# Patient Record
Sex: Female | Born: 1980
Health system: Southern US, Community
[De-identification: ages and names within clinical notes are randomized; demographics above are authoritative.]

## PROBLEM LIST (undated history)

## (undated) DIAGNOSIS — Z789 Other specified health status: Secondary | ICD-10-CM

## (undated) HISTORY — PX: NO PAST SURGERIES: SHX2092

## (undated) HISTORY — PX: WISDOM TOOTH EXTRACTION: SHX21

---

## 2014-09-08 ENCOUNTER — Emergency Department (HOSPITAL_COMMUNITY)
Admission: EM | Admit: 2014-09-08 | Discharge: 2014-09-08 | Disposition: A | Discharge status: Home / Self Care | Payer: 59 | Attending: Emergency Medicine | Admitting: Emergency Medicine

## 2014-09-08 ENCOUNTER — Encounter (HOSPITAL_COMMUNITY): Payer: Self-pay | Admitting: Emergency Medicine

## 2014-09-08 ENCOUNTER — Emergency Department (HOSPITAL_COMMUNITY): Payer: 59

## 2014-09-08 DIAGNOSIS — IMO0002 Reserved for concepts with insufficient information to code with codable children: Secondary | ICD-10-CM

## 2014-09-08 DIAGNOSIS — S66801A Unspecified injury of other specified muscles, fascia and tendons at wrist and hand level, right hand, initial encounter: Secondary | ICD-10-CM

## 2014-09-08 DIAGNOSIS — Y93G1 Activity, food preparation and clean up: Secondary | ICD-10-CM | POA: Insufficient documentation

## 2014-09-08 DIAGNOSIS — F419 Anxiety disorder, unspecified: Secondary | ICD-10-CM | POA: Insufficient documentation

## 2014-09-08 DIAGNOSIS — Y92090 Kitchen in other non-institutional residence as the place of occurrence of the external cause: Secondary | ICD-10-CM | POA: Insufficient documentation

## 2014-09-08 DIAGNOSIS — S61011A Laceration without foreign body of right thumb without damage to nail, initial encounter: Secondary | ICD-10-CM | POA: Insufficient documentation

## 2014-09-08 DIAGNOSIS — W25XXXA Contact with sharp glass, initial encounter: Secondary | ICD-10-CM | POA: Insufficient documentation

## 2014-09-08 MED ORDER — TETANUS-DIPHTH-ACELL PERTUSSIS 5-2.5-18.5 LF-MCG/0.5 IM SUSP: 0.5000 mL | Freq: Once | INTRAMUSCULAR | Status: DC

## 2014-09-08 MED ORDER — LIDOCAINE HCL 1 % IJ SOLN
10.0000 mL | Freq: Once | INTRAMUSCULAR | Status: AC
  Administered 2014-09-08: 10 mL

## 2014-09-08 MED ORDER — CEPHALEXIN 500 MG PO CAPS: 500.0000 mg | ORAL_CAPSULE | Freq: Four times a day (QID) | ORAL | Status: AC

## 2014-09-08 MED ORDER — IBUPROFEN 600 MG PO TABS: 600.0000 mg | ORAL_TABLET | Freq: Four times a day (QID) | ORAL | Status: AC | PRN

## 2014-09-08 NOTE — ED Notes (Signed)
Pt states she was unlaoding her dishwasher when a pan broke and lacerated her right inner thumb. Bleeding is controlled upon assessment.

## 2014-09-08 NOTE — ED Provider Notes (Signed)
CSN: [id number]     Arrival date & time 09/08/14  1935 History  This chart was scribed for non-physician practitioner, Antony MaduraKelly Franklin Baumbach, PA-C, working with Toy CookeyMegan Docherty, MD by Milly JakobJohn Lee Graves, ED Scribe. The patient was seen in room WTR8/WTR8. Patient's care was started at 8:05 PM.  Chief Complaint  Patient presents with  . Extremity Laceration   The history is provided by the patient. No language interpreter was used.   HPI Comments: Deborah Ramos is a 33 y.o. female who presents to the Emergency Department after a deep laceration to the base of her right thumb about 45 minutes ago. She states that she shattered a clean glass dish in the kitchen, and that there may be glass pieces in the wound. She reports constant, moderate, throbbing pain at the site of the laceration. She reports limited mobility of her right thumb. She denies numbness or loss of sensation. She is unsure if her Tetanus is UTD. She denies other injury.  History reviewed. No pertinent past medical history. History reviewed. No pertinent past surgical history. No family history on file. History  Substance Use Topics  . Smoking status: Never Smoker   . Smokeless tobacco: Never Used  . Alcohol Use: No   OB History    No data available     Review of Systems  Skin: Positive for wound (right thumb laceration). Negative for pallor.  Neurological: Negative for numbness.  All other systems reviewed and are negative.   Allergies  Review of patient's allergies indicates no known allergies.  Home Medications   Prior to Admission medications   Medication Sig Start Date End Date Taking? Authorizing Provider  nicotine polacrilex (NICOTINE MINI) 2 MG lozenge Take 2 mg by mouth as needed for smoking cessation.   Yes Historical Provider, MD  cephALEXin (KEFLEX) 500 MG capsule Take 1 capsule (500 mg total) by mouth 4 (four) times daily. 09/08/14   Antony MaduraKelly Li Fragoso, PA-C  ibuprofen (ADVIL,MOTRIN) 600 MG tablet Take 1 tablet (600 mg  total) by mouth every 6 (six) hours as needed. 09/08/14   Antony MaduraKelly Usbaldo Pannone, PA-C   Triage Vitals: BP 123/86 mmHg  Pulse 68  Temp(Src) 98.9 F (37.2 C)  Resp 18  Ht 5\' 4"  (1.626 m)  Wt 120 lb (54.432 kg)  BMI 20.59 kg/m2  SpO2 100%  LMP 08/14/2014 (Approximate)  Physical Exam  Constitutional: She is oriented to person, place, and time. She appears well-developed and well-nourished. No distress.  HENT:  Head: Normocephalic and atraumatic.  Eyes: Conjunctivae and EOM are normal. No scleral icterus.  Neck: Normal range of motion.  Cardiovascular: Normal rate, regular rhythm and intact distal pulses.   2+ distal radial pulse in RUE. Capillary refill brisk in all digits of R hand.  Pulmonary/Chest: Effort normal. No respiratory distress.  Musculoskeletal: She exhibits tenderness.       Right hand: She exhibits decreased range of motion, tenderness and laceration (2cm laceration just distal to MCP joint on palmar aspect of R thumb). She exhibits no bony tenderness, normal capillary refill, no deformity and no swelling. Normal sensation noted.  Unable to flex R thumb at DIP joint. Tendon evident when manually extending R thumb. Suspect severed flexor pollicis longus tendon. No evidence of FB within wound. Exam limited secondary to patient anxiety.  Neurological: She is alert and oriented to person, place, and time. She exhibits normal muscle tone. Coordination normal.  Sensation to light touch intact in distal tips of all fingers of R hand.  Skin: Skin  is warm and dry. No rash noted. She is not diaphoretic. No erythema. No pallor.  Psychiatric: Her behavior is normal. Her mood appears anxious. Her speech is rapid and/or pressured.  Nursing note and vitals reviewed.   ED Course  Procedures (including critical care time) DIAGNOSTIC STUDIES: Oxygen Saturation is 100% on room air, normal by my interpretation.    COORDINATION OF CARE: 8:20 PM-Discussed treatment plan which includes left thumb  X-ray and f/u with a hand surgeon with pt at bedside and pt agreed to plan.   Labs Review Labs Reviewed - No data to display  Imaging Review Dg Finger Thumb Right  09/08/2014   CLINICAL DATA:  Laceration at the MCP joint related to glass pan.  EXAM: RIGHT THUMB 2+V  COMPARISON:  None.  FINDINGS: Soft tissue injury is noted full are too the metacarpal phalangeal joint. No evidence of fracture, radiopaque foreign object or gas in the joint.  IMPRESSION: Soft tissue injury. No bony or articular abnormality. No radiopaque foreign object.   Electronically Signed   By: Paulina FusiMark  Shogry M.D.   On: 09/08/2014 21:17     EKG Interpretation None       LACERATION REPAIR Performed by: Antony MaduraHUMES, Deldrick Linch Authorized by: Antony MaduraHUMES, Shanik [place] Consent: Verbal consent obtained. Risks and benefits: risks, benefits and alternatives were discussed Consent given by: patient Patient identity confirmed: provided demographic data Prepped and Draped in normal sterile fashion Wound explored  Laceration Location: Palmar R thumb  Laceration Length: 2cm  No Foreign Bodies seen or palpated  Anesthesia: local infiltration  Local anesthetic: lidocaine 1% without epinephrine  Anesthetic total: 1 ml  Irrigation method: syringe Amount of cleaning: standard  Skin closure: 5-0 prolene  Number of sutures: 2  Technique: simple interrupted  Patient tolerance: Patient tolerated the procedure well with no immediate complications.   MDM   Final diagnoses:  Laceration  Injury of flexor tendon of hand, right, initial encounter  Thumb laceration, right, initial encounter    33 year old female presents to the emergency department for further evaluation of laceration. Laceration sustained from glass plate which broke while doing dishes.patient is neurovascularly intact on exam. Laceration is appreciated to be just distal to the MCP joint of the right thumb on the palmar aspect. Patient is unable to flex her DIP joint of this  thumb suggesting likely flexor pollicis longus tendon injury. Imaging shows no evidence of foreign body or fracture.   Laceration closed in ED and patient placed on Keflex course. Case discussed with Dr. Amanda PeaGramig who will follow up with patient in the office on 09/09/2014.tetanus advised as patient cannot recall the exact date of her last tetanus. She declines, as she states that she believes she is up-to-date. She states that she will confirm this with her primary care doctor and up-to-date her tetanus with him if necessary. Patient stable and appropriate for discharge at this time. Return precautions provided and patient agreeable to plan with no unaddressed concerns.  I personally performed the services described in this documentation, which was scribed in my presence. The recorded information has been reviewed and is accurate.   Filed Vitals:   09/08/14 2033  BP: 123/86  Pulse: 68  Temp: 98.9 F (37.2 C)  Resp: 18  Height: 5\' 4"  (1.626 m)  Weight: 120 lb (54.432 kg)  SpO2: 100%      Antony MaduraKelly Raykwon Hobbs, PA-C 09/09/14 [phone]

## 2014-09-08 NOTE — Discharge Instructions (Signed)
Tendon Injury Tendons are strong, cordlike structures that connect muscle to bone. Tendons are made up of woven fibers, like a rope. A tendon injury is a tear (rupture) of the tendon. The rupture may be partial (only a few of the fibers in your tendon rupture) or complete (your entire tendon ruptures). CAUSES  Tendon injuries can be caused by high-stress activities, such as sports. They also can be caused by a repetitive injury or by a single injury from an excessive, rapid force. SYMPTOMS  Symptoms of tendon injury include pain when you move the joint close to the tendon. Other symptoms are swelling, redness, and warmth. DIAGNOSIS  Tendon injuries often can be diagnosed by physical exam. However, sometimes an X-ray exam or advanced imaging, such as magnetic resonance imaging (MRI), is necessary to determine the extent of the injury. TREATMENT  Partial tendon ruptures often can be treated with immobilization. A splint, bandage, or removable brace usually is used to immobilize the injured tendon. Most injured tendons need to be immobilized for 1-2 months before they are completely healed. Complete tendon ruptures may require surgical reattachment. Document Released: 12/02/2004 Document Revised: 10/14/2011 Document Reviewed: 01/16/2012 [facility] Patient Information 2015 [place]. This information is not intended to replace advice given to you by your health care provider. Make sure you discuss any questions you have with your health care provider. Laceration Care, Adult A laceration is a cut or lesion that goes through all layers of the skin and into the tissue just beneath the skin. TREATMENT  Some lacerations may not require closure. Some lacerations may not be able to be closed due to an increased risk of infection. It is important to see your caregiver as soon as possible after an injury to minimize the risk of infection and maximize the opportunity for successful closure. If closure is  appropriate, pain medicines may be given, if needed. The wound will be cleaned to help prevent infection. Your caregiver will use stitches (sutures), staples, wound glue (adhesive), or skin adhesive strips to repair the laceration. These tools bring the skin edges together to allow for faster healing and a better cosmetic outcome. However, all wounds will heal with a scar. Once the wound has healed, scarring can be minimized by covering the wound with sunscreen during the day for 1 full year. HOME CARE INSTRUCTIONS  For sutures or staples:  Keep the wound clean and dry.  If you were given a bandage (dressing), you should change it at least once a day. Also, change the dressing if it becomes wet or dirty, or as directed by your caregiver.  Wash the wound with soap and water 2 times a day. Rinse the wound off with water to remove all soap. Pat the wound dry with a clean towel.  After cleaning, apply a thin layer of the antibiotic ointment as recommended by your caregiver. This will help prevent infection and keep the dressing from sticking.  You may shower as usual after the first 24 hours. Do not soak the wound in water until the sutures are removed.  Only take over-the-counter or prescription medicines for pain, discomfort, or fever as directed by your caregiver.  Get your sutures or staples removed as directed by your caregiver. For skin adhesive strips:  Keep the wound clean and dry.  Do not get the skin adhesive strips wet. You may bathe carefully, using caution to keep the wound dry.  If the wound gets wet, pat it dry with a clean towel.  Skin  skin adhesive strips wet. You may bathe carefully, using caution to keep the wound dry.   If the wound gets wet, pat it dry with a clean towel.   Skin adhesive strips will fall off on their own. You may trim the strips as the wound heals. Do not remove skin adhesive strips that are still stuck to the wound. They will fall off in time.  For wound adhesive:   You may briefly wet your wound in the shower or bath. Do not soak or scrub the wound. Do not swim. Avoid periods of heavy perspiration until  the skin adhesive has fallen off on its own. After showering or bathing, gently pat the wound dry with a clean towel.   Do not apply liquid medicine, cream medicine, or ointment medicine to your wound while the skin adhesive is in place. This may loosen the film before your wound is healed.   If a dressing is placed over the wound, be careful not to apply tape directly over the skin adhesive. This may cause the adhesive to be pulled off before the wound is healed.   Avoid prolonged exposure to sunlight or tanning lamps while the skin adhesive is in place. Exposure to ultraviolet light in the first year will darken the scar.   The skin adhesive will usually remain in place for 5 to 10 days, then naturally fall off the skin. Do not pick at the adhesive film.  You may need a tetanus shot if:   You cannot remember when you had your last tetanus shot.   You have never had a tetanus shot.  If you get a tetanus shot, your arm may swell, get red, and feel warm to the touch. This is common and not a problem. If you need a tetanus shot and you choose not to have one, there is a rare chance of getting tetanus. Sickness from tetanus can be serious.  SEEK MEDICAL CARE IF:    You have redness, swelling, or increasing pain in the wound.   You see a red line that goes away from the wound.   You have yellowish-white fluid (pus) coming from the wound.   You have a fever.   You notice a bad smell coming from the wound or dressing.   Your wound breaks open before or after sutures have been removed.   You notice something coming out of the wound such as wood or glass.   Your wound is on your hand or foot and you cannot move a finger or toe.  SEEK IMMEDIATE MEDICAL CARE IF:    Your pain is not controlled with prescribed medicine.   You have severe swelling around the wound causing pain and numbness or a change in color in your arm, hand, leg, or foot.   Your wound splits open and starts bleeding.   You have worsening  numbness, weakness, or loss of function of any joint around or beyond the wound.   You develop painful lumps near the wound or on the skin anywhere on your body.  MAKE SURE YOU:    Understand these instructions.   Will watch your condition.   Will get help right away if you are not doing well or get worse.  Document Released: 10/25/2005 Document Revised: 01/17/2012 Document Reviewed: 04/20/2011  [employer] Patient Information 2015 [employer]. This information is not intended to replace advice given to you by your health care provider. Make sure you discuss any questions you have with your

## 2014-09-08 NOTE — ED Notes (Signed)
Ortho Tech at bedside to splint thumb

## 2014-09-09 ENCOUNTER — Encounter (HOSPITAL_COMMUNITY): Payer: Self-pay | Admitting: *Deleted

## 2014-09-09 NOTE — Progress Notes (Signed)
No pre-op orders in EPIC, called and left message at Dr. Carlos LeveringGramig's office to [facility]

## 2014-09-10 ENCOUNTER — Ambulatory Visit (HOSPITAL_COMMUNITY)
Admission: RE | Admit: 2014-09-10 | Discharge: 2014-09-10 | Disposition: A | Discharge status: Home / Self Care | Payer: 59 | Source: Ambulatory Visit | Attending: Orthopedic Surgery | Admitting: Orthopedic Surgery

## 2014-09-10 ENCOUNTER — Ambulatory Visit (HOSPITAL_COMMUNITY): Payer: 59 | Admitting: Anesthesiology

## 2014-09-10 ENCOUNTER — Encounter (HOSPITAL_COMMUNITY): Payer: Self-pay | Admitting: *Deleted

## 2014-09-10 ENCOUNTER — Encounter (HOSPITAL_COMMUNITY)
Admission: RE | Disposition: A | Discharge status: Home / Self Care | Payer: Self-pay | Source: Ambulatory Visit | Attending: Orthopedic Surgery

## 2014-09-10 DIAGNOSIS — Z87891 Personal history of nicotine dependence: Secondary | ICD-10-CM | POA: Diagnosis not present

## 2014-09-10 DIAGNOSIS — Y929 Unspecified place or not applicable: Secondary | ICD-10-CM | POA: Insufficient documentation

## 2014-09-10 DIAGNOSIS — S66021A Laceration of long flexor muscle, fascia and tendon of right thumb at wrist and hand level, initial encounter: Secondary | ICD-10-CM | POA: Diagnosis not present

## 2014-09-10 DIAGNOSIS — X58XXXA Exposure to other specified factors, initial encounter: Secondary | ICD-10-CM | POA: Diagnosis not present

## 2014-09-10 DIAGNOSIS — Z79899 Other long term (current) drug therapy: Secondary | ICD-10-CM | POA: Insufficient documentation

## 2014-09-10 HISTORY — DX: Other specified health status: Z78.9

## 2014-09-10 HISTORY — PX: I & D EXTREMITY: SHX5045

## 2014-09-10 LAB — CBC
HCT: 45 % (ref 36.0–46.0)
Hemoglobin: 15.5 g/dL — ABNORMAL HIGH (ref 12.0–15.0)
MCH: 31.7 pg (ref 26.0–34.0)
MCHC: 34.4 g/dL (ref 30.0–36.0)
MCV: 92 fL (ref 78.0–100.0)
PLATELETS: 310 10*3/uL (ref 150–400)
RBC: 4.89 MIL/uL (ref 3.87–5.11)
RDW: 12.5 % (ref 11.5–15.5)
WBC: 9.4 10*3/uL (ref 4.0–10.5)

## 2014-09-10 LAB — HCG, SERUM, QUALITATIVE: Preg, Serum: NEGATIVE

## 2014-09-10 SURGERY — IRRIGATION AND DEBRIDEMENT EXTREMITY
Anesthesia: Monitor Anesthesia Care | Site: Thumb | Laterality: Right

## 2014-09-10 MED ORDER — HYDROMORPHONE HCL 1 MG/ML IJ SOLN: 0.2500 mg | INTRAMUSCULAR | Status: DC | PRN

## 2014-09-10 MED ORDER — MIDAZOLAM HCL 2 MG/2ML IJ SOLN
INTRAMUSCULAR | Status: AC
  Filled 2014-09-10: qty 2

## 2014-09-10 MED ORDER — FENTANYL CITRATE 0.05 MG/ML IJ SOLN
INTRAMUSCULAR | Status: AC
  Administered 2014-09-10: 100 ug
  Filled 2014-09-10: qty 2

## 2014-09-10 MED ORDER — ONDANSETRON HCL 4 MG/2ML IJ SOLN
INTRAMUSCULAR | Status: DC | PRN
  Administered 2014-09-10: 4 mg via INTRAVENOUS

## 2014-09-10 MED ORDER — OXYCODONE HCL 5 MG/5ML PO SOLN: 5.0000 mg | Freq: Once | ORAL | Status: DC | PRN

## 2014-09-10 MED ORDER — OXYCODONE HCL 5 MG PO TABS: 5.0000 mg | ORAL_TABLET | Freq: Once | ORAL | Status: DC | PRN

## 2014-09-10 MED ORDER — PROPOFOL 10 MG/ML IV BOLUS
INTRAVENOUS | Status: AC
  Filled 2014-09-10: qty 20

## 2014-09-10 MED ORDER — SUFENTANIL CITRATE 50 MCG/ML IV SOLN
INTRAVENOUS | Status: DC | PRN
  Administered 2014-09-10: 10 ug via INTRAVENOUS

## 2014-09-10 MED ORDER — ONDANSETRON HCL 4 MG/2ML IJ SOLN: 4.0000 mg | Freq: Once | INTRAMUSCULAR | Status: DC | PRN

## 2014-09-10 MED ORDER — SODIUM CHLORIDE 0.9 % IR SOLN
Status: DC | PRN
  Administered 2014-09-10: 1000 mL

## 2014-09-10 MED ORDER — SUFENTANIL CITRATE 50 MCG/ML IV SOLN
INTRAVENOUS | Status: AC
  Filled 2014-09-10: qty 1

## 2014-09-10 MED ORDER — MIDAZOLAM HCL 2 MG/2ML IJ SOLN
INTRAMUSCULAR | Status: AC
  Administered 2014-09-10: 2 mg
  Filled 2014-09-10: qty 2

## 2014-09-10 MED ORDER — DEXAMETHASONE SODIUM PHOSPHATE 4 MG/ML IJ SOLN
INTRAMUSCULAR | Status: AC
  Filled 2014-09-10: qty 1

## 2014-09-10 MED ORDER — MIDAZOLAM HCL 5 MG/5ML IJ SOLN
INTRAMUSCULAR | Status: DC | PRN
  Administered 2014-09-10: 2 mg via INTRAVENOUS

## 2014-09-10 MED ORDER — LACTATED RINGERS IV SOLN
INTRAVENOUS | Status: DC
  Administered 2014-09-10: 15:00:00 via INTRAVENOUS

## 2014-09-10 MED ORDER — MEPERIDINE HCL 25 MG/ML IJ SOLN: 6.2500 mg | INTRAMUSCULAR | Status: DC | PRN

## 2014-09-10 MED ORDER — CEFAZOLIN SODIUM-DEXTROSE 2-3 GM-% IV SOLR
INTRAVENOUS | Status: DC | PRN
  Administered 2014-09-10: 2 g via INTRAVENOUS

## 2014-09-10 MED ORDER — FENTANYL CITRATE 0.05 MG/ML IJ SOLN
INTRAMUSCULAR | Status: AC
  Filled 2014-09-10: qty 5

## 2014-09-10 MED ORDER — BUPIVACAINE-EPINEPHRINE (PF) 0.5% -1:200000 IJ SOLN
INTRAMUSCULAR | Status: DC | PRN
  Administered 2014-09-10: 30 mL via PERINEURAL

## 2014-09-10 MED ORDER — LIDOCAINE HCL (CARDIAC) 20 MG/ML IV SOLN
INTRAVENOUS | Status: AC
  Filled 2014-09-10: qty 5

## 2014-09-10 MED ORDER — SODIUM CHLORIDE 0.9 % IJ SOLN
INTRAMUSCULAR | Status: AC
  Filled 2014-09-10: qty 10

## 2014-09-10 MED ORDER — ONDANSETRON HCL 4 MG/2ML IJ SOLN
INTRAMUSCULAR | Status: AC
  Filled 2014-09-10: qty 2

## 2014-09-10 MED ORDER — LIDOCAINE HCL (CARDIAC) 20 MG/ML IV SOLN
INTRAVENOUS | Status: DC | PRN
  Administered 2014-09-10 (×2): 40 mg via INTRAVENOUS

## 2014-09-10 MED ORDER — LACTATED RINGERS IV SOLN
INTRAVENOUS | Status: DC | PRN
  Administered 2014-09-10: 17:00:00 via INTRAVENOUS

## 2014-09-10 MED ORDER — DEXAMETHASONE SODIUM PHOSPHATE 4 MG/ML IJ SOLN
INTRAMUSCULAR | Status: DC | PRN
  Administered 2014-09-10: 4 mg via INTRAVENOUS

## 2014-09-10 MED ORDER — BUPIVACAINE HCL (PF) 0.25 % IJ SOLN
INTRAMUSCULAR | Status: AC
  Filled 2014-09-10: qty 30

## 2014-09-10 MED ORDER — CEFAZOLIN SODIUM-DEXTROSE 2-3 GM-% IV SOLR
INTRAVENOUS | Status: AC
  Filled 2014-09-10: qty 50

## 2014-09-10 MED ORDER — PROPOFOL 10 MG/ML IV BOLUS
INTRAVENOUS | Status: DC | PRN
  Administered 2014-09-10: 30 mg via INTRAVENOUS
  Administered 2014-09-10: 20 mg via INTRAVENOUS
  Administered 2014-09-10: 30 mg via INTRAVENOUS
  Administered 2014-09-10: 50 mg via INTRAVENOUS

## 2014-09-10 SURGICAL SUPPLY — 45 items
BANDAGE ELASTIC 3 VELCRO ST LF (GAUZE/BANDAGES/DRESSINGS) ×2 IMPLANT
BANDAGE ELASTIC 4 VELCRO ST LF (GAUZE/BANDAGES/DRESSINGS) ×2 IMPLANT
BNDG CONFORM 2 STRL LF (GAUZE/BANDAGES/DRESSINGS) ×2 IMPLANT
BNDG CONFORM 3 STRL LF (GAUZE/BANDAGES/DRESSINGS) ×2 IMPLANT
BNDG GAUZE ELAST 4 BULKY (GAUZE/BANDAGES/DRESSINGS) ×2 IMPLANT
CORDS BIPOLAR (ELECTRODE) ×2 IMPLANT
CUFF TOURNIQUET SINGLE 18IN (TOURNIQUET CUFF) ×2 IMPLANT
DRSG EMULSION OIL 3X3 NADH (GAUZE/BANDAGES/DRESSINGS) ×2 IMPLANT
GAUZE SPONGE 4X4 12PLY STRL (GAUZE/BANDAGES/DRESSINGS) ×2 IMPLANT
GAUZE XEROFORM 1X8 LF (GAUZE/BANDAGES/DRESSINGS) ×2 IMPLANT
GLOVE BIOGEL M STRL SZ7.5 (GLOVE) ×4 IMPLANT
GLOVE BIOGEL PI IND STRL 6.5 (GLOVE) ×1 IMPLANT
GLOVE BIOGEL PI INDICATOR 6.5 (GLOVE) ×1
GLOVE BIOGEL PI ORTHO PRO SZ7 (GLOVE) ×1
GLOVE PI ORTHO PRO STRL SZ7 (GLOVE) ×1 IMPLANT
GLOVE SS BIOGEL STRL SZ 8 (GLOVE) ×1 IMPLANT
GLOVE SUPERSENSE BIOGEL SZ 8 (GLOVE) ×1
GLOVE SURG SS PI 6.5 STRL IVOR (GLOVE) ×4 IMPLANT
GOWN STRL REUS W/ TWL LRG LVL3 (GOWN DISPOSABLE) ×2 IMPLANT
GOWN STRL REUS W/ TWL XL LVL3 (GOWN DISPOSABLE) ×2 IMPLANT
GOWN STRL REUS W/TWL LRG LVL3 (GOWN DISPOSABLE) ×2
GOWN STRL REUS W/TWL XL LVL3 (GOWN DISPOSABLE) ×2
KIT BASIN OR (CUSTOM PROCEDURE TRAY) ×2 IMPLANT
KIT ROOM TURNOVER OR (KITS) ×2 IMPLANT
MANIFOLD NEPTUNE II (INSTRUMENTS) IMPLANT
NEEDLE HYPO 25GX1X1/2 BEV (NEEDLE) ×2 IMPLANT
NS IRRIG 1000ML POUR BTL (IV SOLUTION) ×2 IMPLANT
PACK ORTHO EXTREMITY (CUSTOM PROCEDURE TRAY) ×2 IMPLANT
PAD ARMBOARD 7.5X6 YLW CONV (MISCELLANEOUS) ×4 IMPLANT
SPLINT FIBERGLASS 4X30 (CAST SUPPLIES) ×2 IMPLANT
SPONGE GAUZE 4X4 12PLY STER LF (GAUZE/BANDAGES/DRESSINGS) ×2 IMPLANT
SPONGE LAP 18X18 X RAY DECT (DISPOSABLE) ×2 IMPLANT
SPONGE LAP 4X18 X RAY DECT (DISPOSABLE) ×2 IMPLANT
SUT FIBERWIRE 3-0 18 TAPR NDL (SUTURE) ×2
SUT FIBERWIRE 4-0 18 TAPR NDL (SUTURE) ×2
SUT PROLENE 4 0 P 3 18 (SUTURE) ×2 IMPLANT
SUT PROLENE 4 0 PS 2 18 (SUTURE) ×2 IMPLANT
SUT PROLENE 6 0 P 1 18 (SUTURE) ×2 IMPLANT
SUTURE FIBERWR 3-0 18 TAPR NDL (SUTURE) ×1 IMPLANT
SUTURE FIBERWR 4-0 18 TAPR NDL (SUTURE) ×1 IMPLANT
TOWEL OR 17X24 6PK STRL BLUE (TOWEL DISPOSABLE) ×2 IMPLANT
TOWEL OR 17X26 10 PK STRL BLUE (TOWEL DISPOSABLE) ×2 IMPLANT
TUBE CONNECTING 12X1/4 (SUCTIONS) ×2 IMPLANT
WATER STERILE IRR 1000ML POUR (IV SOLUTION) ×2 IMPLANT
YANKAUER SUCT BULB TIP NO VENT (SUCTIONS) ×2 IMPLANT

## 2014-09-10 NOTE — Anesthesia Preprocedure Evaluation (Addendum)
Anesthesia Evaluation  Patient identified by MRN, date of birth, ID band Patient awake    Reviewed: Allergy & Precautions, H&P , NPO status , Patient's Chart, lab work & pertinent test results  Airway Mallampati: I  TM Distance: >3 FB Neck ROM: Full    Dental  (+) Teeth Intact, Dental Advisory Given   Pulmonary former smoker,          Cardiovascular negative cardio ROS      Neuro/Psych negative neurological ROS  negative psych ROS   GI/Hepatic negative GI ROS, Neg liver ROS,   Endo/Other  negative endocrine ROS  Renal/GU negative Renal ROS     Musculoskeletal negative musculoskeletal ROS (+)   Abdominal   Peds  Hematology negative hematology ROS (+)   Anesthesia Other Findings   Reproductive/Obstetrics negative OB ROS                            Anesthesia Physical Anesthesia Plan  ASA: I  Anesthesia Plan: Regional and MAC   Post-op Pain Management:    Induction: Intravenous  Airway Management Planned: Simple Face Mask  Additional Equipment:   Intra-op Plan:   Post-operative Plan:   Informed Consent: I have reviewed the patients History and Physical, chart, labs and discussed the procedure including the risks, benefits and alternatives for the proposed anesthesia with the patient or authorized representative who has indicated his/her understanding and acceptance.     Plan Discussed with: CRNA and Surgeon  Anesthesia Plan Comments:        Anesthesia Quick Evaluation

## 2014-09-10 NOTE — H&P (Signed)
Deborah EhrichJacqueline Ramos is an 33 y.o. female.   Chief Complaint: laceration right hand plan I&D and FPL repair HPI: Patient presents for evaluation and treatment of the of their upper extremity predicament. The patient denies neck back chest or of abdominal pain. The patient notes that they have no lower extremity problems. The patient from primarily complains of the upper extremity pain noted.  Past Medical History  Diagnosis Date  . Medical history non-contributory     Past Surgical History  Procedure Laterality Date  . Wisdom tooth extraction    . No past surgeries      Family History  Problem Relation Age of Onset  . Migraines Mother    Social History:  reports that she quit smoking about 11 years ago. She has never used smokeless tobacco. She reports that she does not drink alcohol or use illicit drugs.  Allergies: No Known Allergies  Medications Prior to Admission  Medication Sig Dispense Refill  . 5-Hydroxytryptophan (5-HTP) 100 MG CAPS Take 100 mg by mouth daily.    . cephALEXin (KEFLEX) 500 MG capsule Take 1 capsule (500 mg total) by mouth 4 (four) times daily. 20 capsule 0  . ibuprofen (ADVIL,MOTRIN) 600 MG tablet Take 1 tablet (600 mg total) by mouth every 6 (six) hours as needed. 30 tablet 0  . nicotine polacrilex (NICORETTE) 4 MG gum Take 4 mg by mouth 3 (three) times daily as needed for smoking cessation.    . nicotine polacrilex (NICOTINE MINI) 2 MG lozenge Take 4 mg by mouth as needed for smoking cessation.       Results for orders placed or performed during the hospital encounter of 09/10/14 (from the past 48 hour(s))  CBC     Status: Abnormal   Collection Time: 09/10/14  3:06 PM  Result Value Ref Range   WBC 9.4 4.0 - 10.5 K/uL   RBC 4.89 3.87 - 5.11 MIL/uL   Hemoglobin 15.5 (H) 12.0 - 15.0 g/dL   HCT 82.945.0 56.236.0 - 13.046.0 %   MCV 92.0 78.0 - 100.0 fL   MCH 31.7 26.0 - 34.0 pg   MCHC 34.4 30.0 - 36.0 g/dL   RDW 86.512.5 78.411.5 - 69.615.5 %   Platelets 310 150 - 400 K/uL   hCG, serum, qualitative     Status: None   Collection Time: 09/10/14  3:06 PM  Result Value Ref Range   Preg, Serum NEGATIVE NEGATIVE    Comment:        THE SENSITIVITY OF THIS METHODOLOGY IS >10 mIU/mL.    Dg Finger Thumb Right  09/08/2014   CLINICAL DATA:  Laceration at the MCP joint related to glass pan.  EXAM: RIGHT THUMB 2+V  COMPARISON:  None.  FINDINGS: Soft tissue injury is noted full are too the metacarpal phalangeal joint. No evidence of fracture, radiopaque foreign object or gas in the joint.  IMPRESSION: Soft tissue injury. No bony or articular abnormality. No radiopaque foreign object.   Electronically Signed   By: Paulina FusiMark  Shogry M.D.   On: 09/08/2014 21:17    Review of Systems  Cardiovascular: Negative.   Gastrointestinal: Negative.   Genitourinary: Negative.   Neurological: Negative.   Endo/Heme/Allergies: Negative.   Psychiatric/Behavioral: Negative.     Blood pressure 121/81, pulse 62, temperature 98 F (36.7 C), temperature source Oral, resp. rate 16, height 5\' 4"  (1.626 m), weight 54.432 kg (120 lb), last menstrual period 08/14/2014, SpO2 100 %. Physical Exam  Patient with laceration about the right thumb with loss  of flexion. She is sensate has refill. She has obvious signs of the flexor pollicis longus tendon laceration.  The patient is alert and oriented in no acute distress the patient complains of pain in the affected upper extremity.  The patient is noted to have a normal HEENT exam.  Lung fields show equal chest expansion and no shortness of breath  abdomen exam is nontender without distention.  Lower extremity examination does not show any fracture dislocation or blood clot symptoms.  Pelvis is stable neck and back are stable and nontender Assessment/Plan We will plan for irrigation debridement exploration of her hand she understands risk benefits these don'ts  I discussed with her zone 2 FPL repair protocol as well as other issues germane to her upper  extremity predicament.  We are planning surgery for your upper extremity. The risk and benefits of surgery include risk of bleeding infection anesthesia damage to normal structures and failure of the surgery to accomplish its intended goals of relieving symptoms and restoring function with this in mind we'll going to proceed. I have specifically discussed with the patient the pre-and postoperative regime and the does and don'ts and risk and benefits in great detail. Risk and benefits of surgery also include risk of dystrophy chronic nerve pain failure of the healing process to go onto completion and other inherent risks of surgery The relavent the pathophysiology of the disease/injury process, as well as the alternatives for treatment and postoperative course of action has been discussed in great detail with the patient who desires to proceed.  We will do everything in our power to help you (the patient) restore function to the upper extremity. Is a pleasure to see this patient today.   Karen ChafeGRAMIG III,Bralin Garry M 09/10/2014, 6:36 PM

## 2014-09-10 NOTE — Progress Notes (Signed)
Report given to jodette. Patient was alert and oriented upon report

## 2014-09-10 NOTE — Discharge Instructions (Signed)
.  We recommend that you to take vitamin C 1000 mg a day to promote healing we also recommend that if you require her pain medicine that he take a stool softener to prevent constipation as most pain medicines will have constipation side effects. We recommend either Peri-Colace or Senokot and recommend that you also consider adding MiraLAX to prevent the constipation affects from pain medicine if you are required to use them. These medicines are over the counter and maybe purchased at a local pharmacy. Marland Kitchen.Keep bandage clean and dry.  Call for any problems.  No smoking.  Criteria for driving a car: you should be off your pain medicine for 7-8 hours, able to drive one handed(confident), thinking clearly and feeling able in your judgement to drive. Continue elevation as it will decrease swelling.  If instructed by MD move your fingers within the confines of the bandage/splint.  Use ice if instructed by your MD. Call immediately for any sudden loss of feeling in your hand/arm or change in functional abilities of the extremity. DO NOT TRY TO MOVE THUMB!!!!!!

## 2014-09-10 NOTE — Progress Notes (Signed)
Vitals 106/62 hr 103 100% at end up block

## 2014-09-10 NOTE — Anesthesia Procedure Notes (Signed)
Anesthesia Regional Block:  Axillary brachial plexus block  Pre-Anesthetic Checklist: ,, timeout performed, Correct Patient, Correct Site, Correct Laterality, Correct Procedure, Correct Position, site marked, Risks and benefits discussed,  Surgical consent,  Pre-op evaluation,  At surgeon's request and post-op pain management  Laterality: Right  Prep: chloraprep       Needles:  Injection technique: Single-shot  Needle Type: Echogenic Stimulator Needle     Needle Length: 9cm 9 cm Needle Gauge: 21 and 21 G    Additional Needles:  Procedures: ultrasound guided (picture in chart) and nerve stimulator Axillary brachial plexus block  Nerve Stimulator or Paresthesia:  Response: Median, 0.5 mA,  Response: Radial , 0.5 mA,  Response: Ulnar , 0.6 mA,   Additional Responses:  Musculocutaneous at 0.5645mA Narrative:  Start time: 09/10/2014 6:52 PM End time: 09/10/2014 6:20 PM Injection made incrementally with aspirations every 5 mL.  Events: injection painful  Additional Notes: All 4 nerves identified on u/s using linear probe. Needle advanced and injection performed in real time under live u/s guidance with nerve stimulator used for confirmation. Total of 30cc's 0.5% bupivicaine with 1:200k epi injected in incremental doses equally divided among nerves. Pt tolerated the procedure well with no immediate complications noted.

## 2014-09-10 NOTE — Transfer of Care (Signed)
Immediate Anesthesia Transfer of Care Note  Patient: Deborah Ramos  Procedure(s) Performed: Procedure(s): IRRIGATION AND DEBRIDEMENT  WITH EXPLORATION RIGHT THUMB WITH FPL REPAIR  (Right)  Patient Location: PACU  Anesthesia Type:MAC combined with regional for post-op pain  Level of Consciousness: awake, alert , oriented and patient cooperative  Airway & Oxygen Therapy: Patient Spontanous Breathing  Post-op Assessment: Report given to PACU RN, Post -op Vital signs reviewed and stable and Patient moving all extremities X 4  Post vital signs: Reviewed and stable  Complications: No apparent anesthesia complications

## 2014-09-10 NOTE — Op Note (Signed)
See dictation #[id number] Amanda PeaGramig MD

## 2014-09-11 ENCOUNTER — Encounter (HOSPITAL_COMMUNITY): Payer: Self-pay | Admitting: Orthopedic Surgery

## 2014-09-11 NOTE — Op Note (Signed)
NAMRosanne Ramos:  Luhmann, Damari           ACCOUNT NO.:  [id number]  MEDICAL RECORD NO.:  [id number]  LOCATION:  [facility]                         FACILITY:  [facility]  PHYSICIAN:  Dionne AnoWilliam M. Shamonica Schadt, M.D.DATE OF BIRTH:  11-10-1980  DATE OF PROCEDURE:  09/10/2014 DATE OF DISCHARGE:  09/10/2014                              OPERATIVE REPORT   PREOPERATIVE DIAGNOSIS:  Laceration flexor pollicis longus, right thumb.  POSTOPERATIVE DIAGNOSIS:  Laceration flexor pollicis longus, right thumb.  PROCEDURE: 1. Irrigation and debridement of skin, subcutaneous tissue, periosteal     tissue, tendon, and paratenon tissue of right thumb.  This was an     excisional debridement. 2. Flexor pollicis longus repair zone 2, right thumb 4-strand     technique. 3. Oblique pulley reconstruction, right thumb. 4. Radial and ulnar digital nerve exploration and neurolysis, right     thumb.  SURGEON:  Dionne AnoWilliam M. Amanda PeaGramig, M.D.  ASSISTANT:  Karie ChimeraBrian Buchanan, PA-C.  COMPLICATIONS:  None.  ANESTHESIA:  Block anesthesia with IV sedation.  ESTIMATED BLOOD LOSS:  Minimal.  TOURNIQUET TIME:  Less than an hour.  INDICATIONS:  The patient is a 33 year old female who presents with the above-mentioned diagnosis.  I have counseled her regarding risks and benefits of surgery including risk of infection, bleeding, anesthesia, damage to normal structures, and failure of surgery to accomplish its intended goals of relieving symptoms, and restoring function.  With this in mind, she desires to proceed.  All questions have been encouraged and answered preoperatively.  OPERATIVE PROCEDURE:  The patient was seen by myself and Anesthesia, taken to the operative theater, underwent sterile prep and drape with Betadine scrub and paint.  Following this, the patient underwent a curvilinear incision in a ZellwoodBruner fashion.  We extended proximally and distally the area in question.  Following this, we carefully performed neurolysis of the radial  and ulnar digital nerves with thorough exploration and neurolysis of the nerves inside.  The ulnar digital nerve and radial digital nerve were identified.  Hematoma was removed from the area and they were swept out of harm's way.  Following neurolysis of the radial and ulnar digital nerves, I then performed I and D of skin, subcutaneous tissue, tendon, and paratenon as well as periosteal tissue.  This was an excisional debridement with multiple amounts of saline.  Following this, I then retrieved the tendons proximally and distally.  A milking technique was used and the FPL tendon was fortunately retrieved through the main incision and I did not have to go to the wrist.  We then placed a core stitch of 3-0 FiberWire in both ends of the tendon. We then passed the tendon through portions of the pulley system. However, the oblique pulley had been significantly injured and later was reconstructed.  Following this, we then placed a 25-gauge needle to hold the tendon still, placed an additional 4-0 FiberWire and I pre-placed this.  I then placed a 6-0 Prolene for epitendinous front and back repair.  The back wall was fixed first, followed by tying down the FiberWire for a 4- strand, modified Kessler 2 _________ repair, followed by placement of the epitenon suture on the front side.  The knots were all buried.  The patient  had good gliding characteristics.  I did very carefully fold over the oblique pulley as I wanted to make sure we tried to prevent bowstring as much as possible and performed a reconstruction of the oblique pulley.  The patient tolerated this well. We irrigated copiously with tourniquet deflated, and following this, we were quite pleased to see it looking so well.  The patient had copious irrigation applied.  The wound was closed and the patient had full motion without bunching of the tendon about the pulley system.  I would give her a very favorable prognosis, we can have  an unremarkable recovery.  We have discussed this with she and her family.  I have given her husband actual intraoperative photos of the tendon for her records which were taken intraoperatively.  She tolerated the procedure well.  We will get her going in therapy swiftly and of course I will be available for any problems.  It was a pleasure to see her today.  She will continue her antibiotics, pain management according to our previous specifications, and notify should any problems occur.     Dionne AnoWilliam M. Amanda PeaGramig, M.D.     [facility] LtdWMG/MEDQ  D:  09/10/2014  T:  09/11/2014  Job:  [id number]

## 2014-09-11 NOTE — Anesthesia Postprocedure Evaluation (Signed)
Anesthesia Post Note  Patient: Deborah Ramos  Procedure(s) Performed: Procedure(s) (LRB): IRRIGATION AND DEBRIDEMENT  WITH EXPLORATION RIGHT THUMB WITH FPL REPAIR  (Right)  Anesthesia type: general  Patient location: PACU  Post pain: Pain level controlled  Post assessment: Patient's Cardiovascular Status Stable  Last Vitals:  Filed Vitals:   09/10/14 2115  BP:   Pulse: 66  Temp: 36.4 C  Resp: 14    Post vital signs: Reviewed and stable  Level of consciousness: sedated  Complications: No apparent anesthesia complications

## 2016-02-10 IMAGING — CR DG FINGER THUMB 2+V*R*
3 series · 3 of 3 positions shown · non-contrast
Comparison: None.

CLINICAL DATA: Laceration at the MCP joint related to glass pan.

EXAM:
RIGHT THUMB 2+V

[x finger pa right]
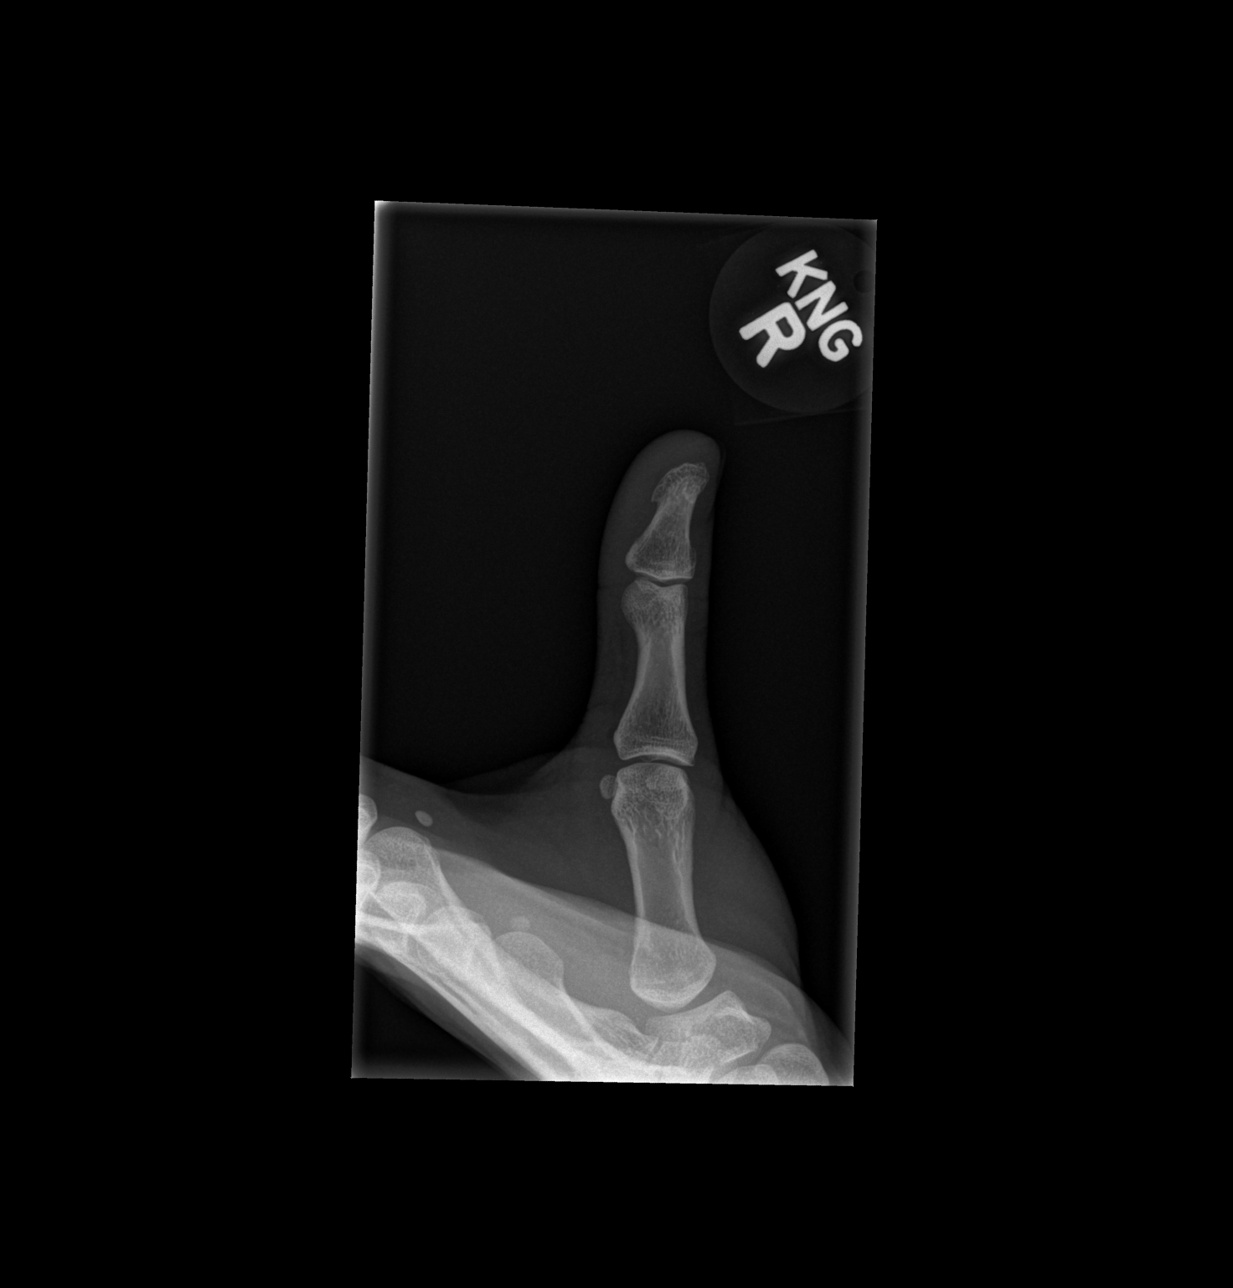

[x finger obl right]
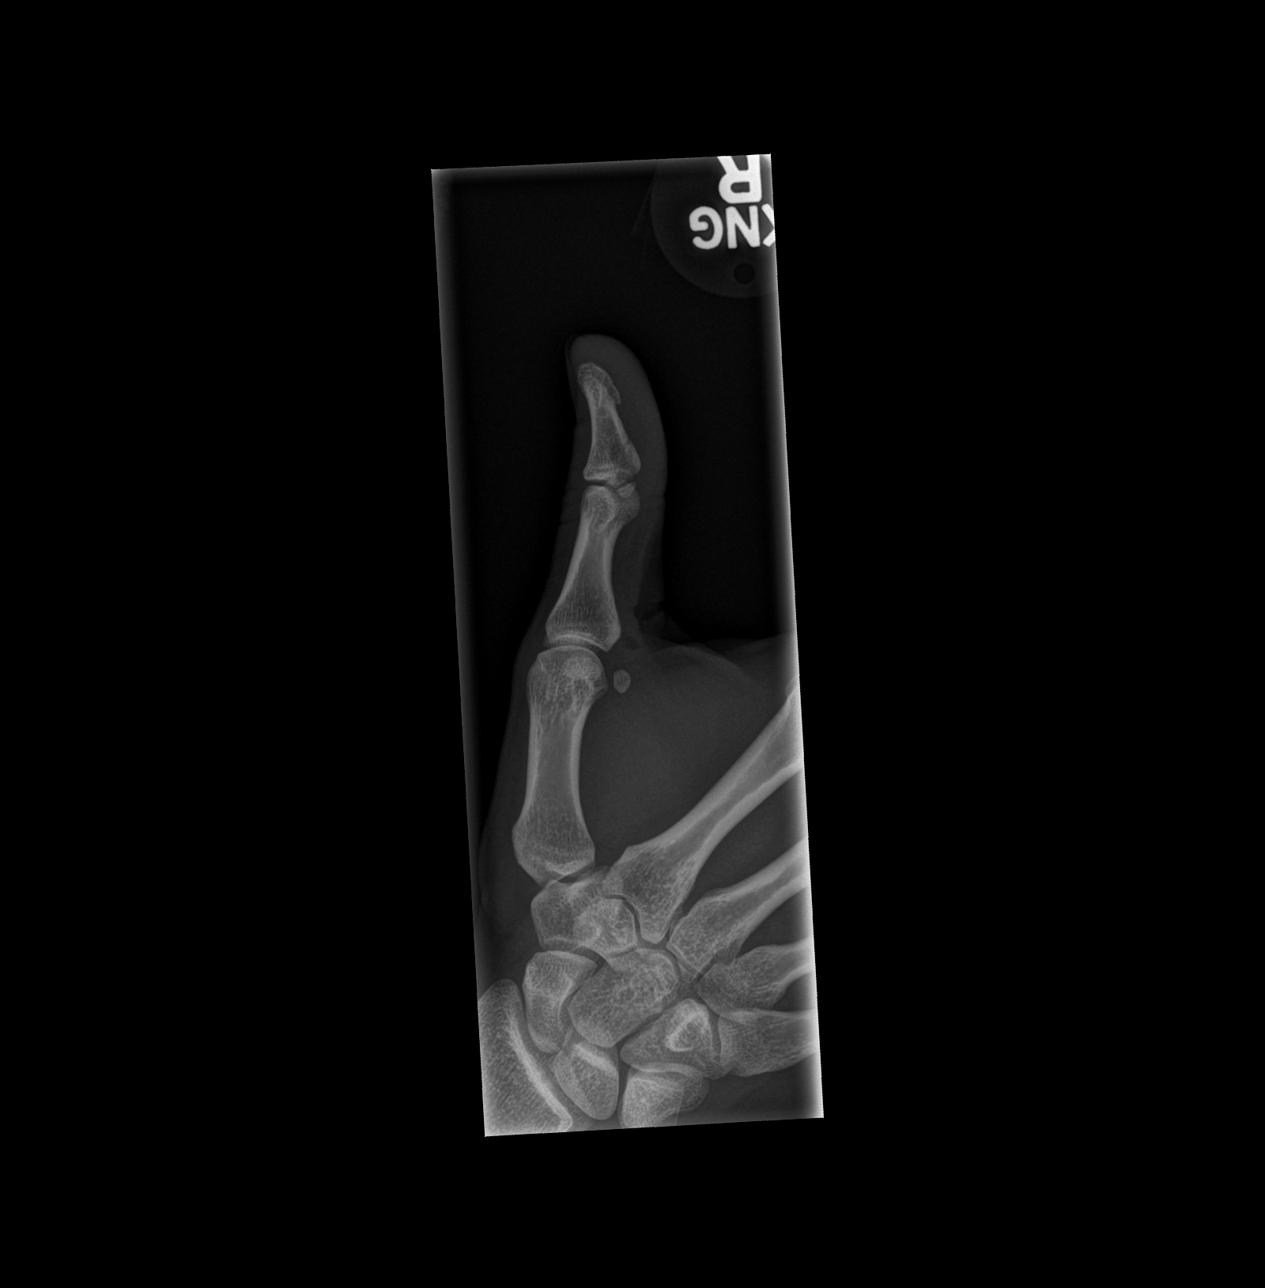

[x finger lat right]
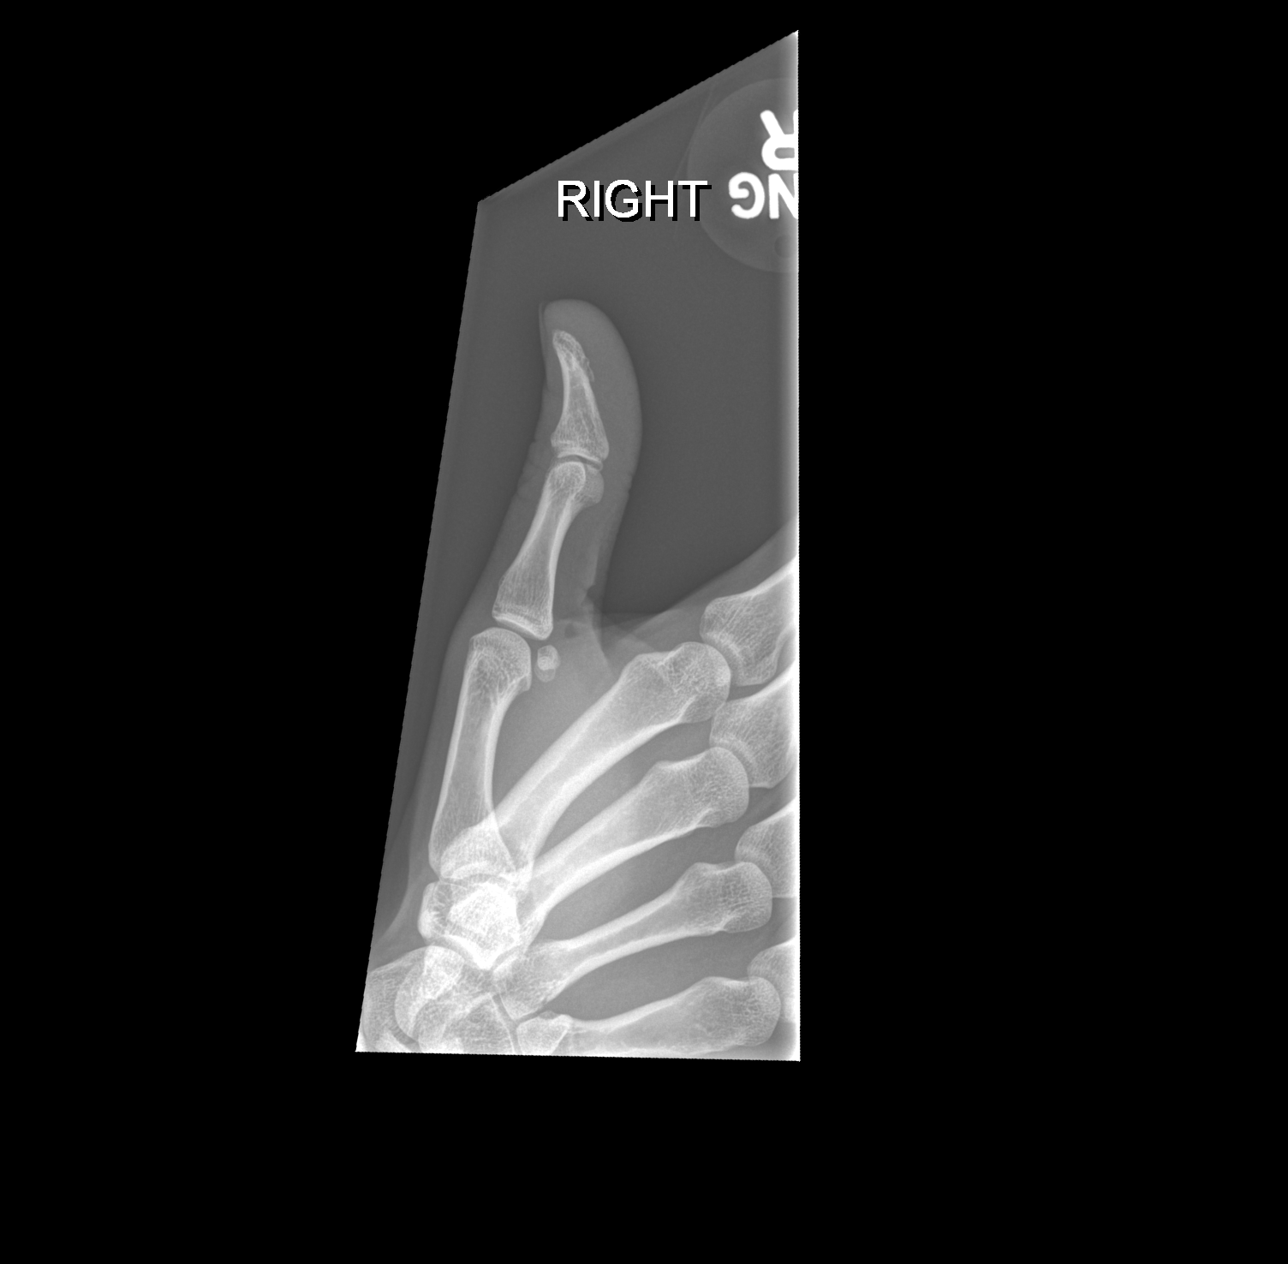

[3 of 3 positions shown; findings below may reference images not displayed]

FINDINGS: Soft tissue injury is noted full are too the metacarpal phalangeal
joint. No evidence of fracture, radiopaque foreign object or gas in
the joint.
IMPRESSION: Soft tissue injury. No bony or articular abnormality. No radiopaque
foreign object.

## 2016-11-17 DIAGNOSIS — Z01419 Encounter for gynecological examination (general) (routine) without abnormal findings: Secondary | ICD-10-CM | POA: Diagnosis not present

## 2016-11-17 DIAGNOSIS — Z6820 Body mass index (BMI) 20.0-20.9, adult: Secondary | ICD-10-CM | POA: Diagnosis not present

## 2016-11-17 DIAGNOSIS — Z1151 Encounter for screening for human papillomavirus (HPV): Secondary | ICD-10-CM | POA: Diagnosis not present
